# Patient Record
Sex: Female | Born: 1985 | Race: White | Hispanic: No | Marital: Single | Smoking: Never smoker
Health system: Southern US, Community
[De-identification: ages and names within clinical notes are randomized; demographics above are authoritative.]

## PROBLEM LIST (undated history)

## (undated) DIAGNOSIS — IMO0002 Reserved for concepts with insufficient information to code with codable children: Secondary | ICD-10-CM

## (undated) DIAGNOSIS — M0800 Unspecified juvenile rheumatoid arthritis of unspecified site: Secondary | ICD-10-CM

## (undated) HISTORY — DX: Unspecified juvenile rheumatoid arthritis of unspecified site: M08.00

## (undated) HISTORY — DX: Reserved for concepts with insufficient information to code with codable children: IMO0002

---

## 2008-03-25 ENCOUNTER — Emergency Department (HOSPITAL_COMMUNITY): Admission: EM | Admit: 2008-03-25 | Discharge: 2008-03-25 | Payer: Self-pay | Admitting: Emergency Medicine

## 2010-06-10 NOTE — L&D Delivery Note (Signed)
SVD VIABLE FEMALE APGAR 8/9.  NO EPISIOTOMY 2 DEGREE TEAR WITH REPAIR WITH 2-0 CHROMIC PLACENTA INTACT WITH 3 VESSEL CORD ANESTHESIA EPIDURAL AND LOCAL EBL 500 CC CHORIOAMNIONITIS ON AMP AND GENT

## 2010-09-21 LAB — HIV ANTIBODY (ROUTINE TESTING W REFLEX): HIV: NONREACTIVE

## 2010-09-21 LAB — HEPATITIS B SURFACE ANTIGEN: Hepatitis B Surface Ag: NEGATIVE

## 2011-03-25 ENCOUNTER — Inpatient Hospital Stay (HOSPITAL_COMMUNITY): Admission: AD | Admit: 2011-03-25 | Payer: Self-pay | Source: Ambulatory Visit | Admitting: Obstetrics and Gynecology

## 2011-03-27 ENCOUNTER — Encounter (HOSPITAL_COMMUNITY): Payer: Self-pay | Admitting: *Deleted

## 2011-03-27 ENCOUNTER — Telehealth (HOSPITAL_COMMUNITY): Payer: Self-pay | Admitting: *Deleted

## 2011-03-27 NOTE — Telephone Encounter (Signed)
Preadmission screen  

## 2011-03-31 ENCOUNTER — Inpatient Hospital Stay (HOSPITAL_COMMUNITY)
Admission: RE | Admit: 2011-03-31 | Discharge: 2011-04-04 | DRG: 774 | Disposition: A | Discharge status: Home / Self Care | Payer: Managed Care, Other (non HMO) | Source: Ambulatory Visit | Attending: Obstetrics and Gynecology | Admitting: Obstetrics and Gynecology

## 2011-03-31 ENCOUNTER — Encounter (HOSPITAL_COMMUNITY): Payer: Self-pay

## 2011-03-31 DIAGNOSIS — O481 Prolonged pregnancy: Secondary | ICD-10-CM

## 2011-03-31 DIAGNOSIS — O480 Post-term pregnancy: Principal | ICD-10-CM | POA: Diagnosis present

## 2011-03-31 DIAGNOSIS — O411090 Infection of amniotic sac and membranes, unspecified, unspecified trimester, not applicable or unspecified: Secondary | ICD-10-CM | POA: Diagnosis present

## 2011-03-31 LAB — CBC
MCH: 29.3 pg (ref 26.0–34.0)
MCHC: 33.4 g/dL (ref 30.0–36.0)
MCV: 87.6 fL (ref 78.0–100.0)
Platelets: 224 10*3/uL (ref 150–400)
RBC: 4.03 MIL/uL (ref 3.87–5.11)

## 2011-03-31 MED ORDER — OXYTOCIN BOLUS FROM INFUSION
500.0000 mL | Freq: Once | INTRAVENOUS | Status: DC
  Filled 2011-03-31: qty 500

## 2011-03-31 MED ORDER — TERBUTALINE SULFATE 1 MG/ML IJ SOLN: 0.2500 mg | Freq: Once | INTRAMUSCULAR | Status: AC | PRN

## 2011-03-31 MED ORDER — FLEET ENEMA 7-19 GM/118ML RE ENEM: 1.0000 | ENEMA | RECTAL | Status: DC | PRN

## 2011-03-31 MED ORDER — LACTATED RINGERS IV SOLN
INTRAVENOUS | Status: DC
  Administered 2011-03-31 – 2011-04-01 (×2): via INTRAVENOUS
  Administered 2011-04-01 (×2): 125 mL/h via INTRAVENOUS

## 2011-03-31 MED ORDER — OXYCODONE-ACETAMINOPHEN 5-325 MG PO TABS: 2.0000 | ORAL_TABLET | ORAL | Status: DC | PRN

## 2011-03-31 MED ORDER — LACTATED RINGERS IV SOLN
500.0000 mL | INTRAVENOUS | Status: DC | PRN
  Administered 2011-04-01 (×2): 300 mL via INTRAVENOUS

## 2011-03-31 MED ORDER — IBUPROFEN 600 MG PO TABS: 600.0000 mg | ORAL_TABLET | Freq: Four times a day (QID) | ORAL | Status: DC | PRN

## 2011-03-31 MED ORDER — LIDOCAINE HCL (PF) 1 % IJ SOLN
30.0000 mL | INTRAMUSCULAR | Status: DC | PRN
  Filled 2011-03-31 (×2): qty 30

## 2011-03-31 MED ORDER — OXYTOCIN 20 UNITS IN LACTATED RINGERS INFUSION - SIMPLE: 125.0000 mL/h | Freq: Once | INTRAVENOUS | Status: DC

## 2011-03-31 MED ORDER — ACETAMINOPHEN 325 MG PO TABS
650.0000 mg | ORAL_TABLET | ORAL | Status: DC | PRN
  Administered 2011-04-01: 650 mg via ORAL
  Filled 2011-03-31: qty 2

## 2011-03-31 MED ORDER — ZOLPIDEM TARTRATE 10 MG PO TABS
10.0000 mg | ORAL_TABLET | Freq: Every evening | ORAL | Status: DC | PRN
  Administered 2011-03-31: 10 mg via ORAL
  Filled 2011-03-31: qty 1

## 2011-03-31 MED ORDER — ONDANSETRON HCL 4 MG/2ML IJ SOLN: 4.0000 mg | Freq: Four times a day (QID) | INTRAMUSCULAR | Status: DC | PRN

## 2011-03-31 MED ORDER — CITRIC ACID-SODIUM CITRATE 334-500 MG/5ML PO SOLN: 30.0000 mL | ORAL | Status: DC | PRN

## 2011-03-31 MED ORDER — DINOPROSTONE 10 MG VA INST
10.0000 mg | VAGINAL_INSERT | Freq: Once | VAGINAL | Status: AC
  Administered 2011-03-31: 10 mg via VAGINAL
  Filled 2011-03-31: qty 1

## 2011-03-31 NOTE — Progress Notes (Signed)
Dr. Rana Snare called and informed of induction arrival, vag exam, FHR, and UC pattern.  Admission orders received and entered.

## 2011-03-31 NOTE — Progress Notes (Addendum)
Cervadil placed.  Patient tolerated well.  Ambien also given at this time.

## 2011-04-01 ENCOUNTER — Encounter (HOSPITAL_COMMUNITY): Payer: Self-pay | Admitting: Anesthesiology

## 2011-04-01 ENCOUNTER — Ambulatory Visit (HOSPITAL_COMMUNITY): Payer: Managed Care, Other (non HMO) | Admitting: Anesthesiology

## 2011-04-01 LAB — RPR: RPR Ser Ql: NONREACTIVE

## 2011-04-01 MED ORDER — DIPHENHYDRAMINE HCL 50 MG/ML IJ SOLN: 12.5000 mg | INTRAMUSCULAR | Status: DC | PRN

## 2011-04-01 MED ORDER — EPHEDRINE 5 MG/ML INJ
10.0000 mg | INTRAVENOUS | Status: DC | PRN
  Filled 2011-04-01 (×2): qty 4

## 2011-04-01 MED ORDER — FENTANYL 2.5 MCG/ML BUPIVACAINE 1/10 % EPIDURAL INFUSION (WH - ANES)
14.0000 mL/h | INTRAMUSCULAR | Status: DC
  Administered 2011-04-01 – 2011-04-02 (×4): 14 mL/h via EPIDURAL
  Filled 2011-04-01 (×4): qty 60

## 2011-04-01 MED ORDER — SODIUM CHLORIDE 0.9 % IV SOLN
2.0000 g | INTRAVENOUS | Status: DC
  Administered 2011-04-01 – 2011-04-02 (×4): 2 g via INTRAVENOUS
  Filled 2011-04-01 (×5): qty 2000

## 2011-04-01 MED ORDER — LACTATED RINGERS IV SOLN
500.0000 mL | Freq: Once | INTRAVENOUS | Status: AC
  Administered 2011-04-01: 500 mL via INTRAVENOUS

## 2011-04-01 MED ORDER — EPHEDRINE 5 MG/ML INJ
10.0000 mg | INTRAVENOUS | Status: DC | PRN
  Filled 2011-04-01: qty 4

## 2011-04-01 MED ORDER — OXYTOCIN 20 UNITS IN LACTATED RINGERS INFUSION - SIMPLE
1.0000 m[IU]/min | INTRAVENOUS | Status: DC
  Administered 2011-04-01: 2 m[IU]/min via INTRAVENOUS
  Administered 2011-04-02: 333 m[IU]/min via INTRAVENOUS
  Filled 2011-04-01: qty 1000

## 2011-04-01 MED ORDER — LIDOCAINE HCL 1.5 % IJ SOLN
INTRAMUSCULAR | Status: DC | PRN
  Administered 2011-04-01 (×2): 5 mL via EPIDURAL

## 2011-04-01 MED ORDER — PHENYLEPHRINE 40 MCG/ML (10ML) SYRINGE FOR IV PUSH (FOR BLOOD PRESSURE SUPPORT)
80.0000 ug | PREFILLED_SYRINGE | INTRAVENOUS | Status: DC | PRN
Start: 2011-04-01 — End: 2011-04-02
  Filled 2011-04-01 (×2): qty 5

## 2011-04-01 MED ORDER — TERBUTALINE SULFATE 1 MG/ML IJ SOLN: 0.2500 mg | Freq: Once | INTRAMUSCULAR | Status: AC | PRN

## 2011-04-01 MED ORDER — GENTAMICIN SULFATE 40 MG/ML IJ SOLN
150.0000 mg | Freq: Three times a day (TID) | INTRAVENOUS | Status: DC
  Administered 2011-04-01: 150 mg via INTRAVENOUS
  Filled 2011-04-01 (×3): qty 3.75

## 2011-04-01 MED ORDER — PHENYLEPHRINE 40 MCG/ML (10ML) SYRINGE FOR IV PUSH (FOR BLOOD PRESSURE SUPPORT)
80.0000 ug | PREFILLED_SYRINGE | INTRAVENOUS | Status: DC | PRN
  Filled 2011-04-01: qty 5

## 2011-04-01 MED ORDER — GENTAMICIN IN SALINE 1.2-0.9 MG/ML-% IV SOLN: 60.0000 mg | Freq: Three times a day (TID) | INTRAVENOUS | Status: DC

## 2011-04-01 NOTE — Anesthesia Procedure Notes (Signed)
Epidural Patient location during procedure: OB Start time: 04/01/2011 1:32 PM  Staffing Performed by: anesthesiologist   Preanesthetic Checklist Completed: patient identified, site marked, surgical consent, pre-op evaluation, timeout performed, IV checked, risks and benefits discussed and monitors and equipment checked  Epidural Patient position: sitting Prep: site prepped and draped and DuraPrep Patient monitoring: continuous pulse ox and blood pressure Approach: midline Injection technique: LOR air and LOR saline  Needle:  Needle type: Tuohy  Needle gauge: 17 G Needle length: 9 cm Needle insertion depth: 6 cm Catheter type: closed end flexible Catheter size: 19 Gauge Catheter at skin depth: 11 cm Test dose: negative  Assessment Events: blood not aspirated, injection not painful, no injection resistance, negative IV test and no paresthesia  Additional Notes Patient identified.  Risk benefits discussed including failed block, incomplete pain control, headache, nerve damage, paralysis, blood pressure changes, nausea, vomiting, reactions to medication both toxic or allergic, and postpartum back pain.  Patient expressed understanding and wished to proceed.  All questions were answered.  Sterile technique used throughout procedure and epidural site dressed with sterile barrier dressing. No paresthesia or other complications noted.The patient did not experience any signs of intravascular injection such as tinnitus or metallic taste in mouth nor signs of intrathecal spread such as rapid motor block. Please see nursing notes for vital signs.

## 2011-04-01 NOTE — Anesthesia Preprocedure Evaluation (Addendum)
Anesthesia Evaluation  Patient identified by MRN, date of birth, ID band Patient awake  General Assessment Comment  Reviewed: Allergy & Precautions, H&P , Patient's Chart, lab work & pertinent test results  Airway Mallampati: II TM Distance: >3 FB Neck ROM: full    Dental No notable dental hx.    Pulmonary  clear to auscultation  Pulmonary exam normal       Cardiovascular regular Normal    Neuro/Psych Negative Neurological ROS  Negative Psych ROS   GI/Hepatic negative GI ROS Neg liver ROS    Endo/Other  Negative Endocrine ROS  Renal/GU negative Renal ROS     Musculoskeletal   Abdominal   Peds  Hematology negative hematology ROS (+)   Anesthesia Other Findings Fluid bolus given  Reproductive/Obstetrics (+) Pregnancy                          Anesthesia Physical Anesthesia Plan  ASA: II  Anesthesia Plan: Epidural   Post-op Pain Management:    Induction:   Airway Management Planned:   Additional Equipment:   Intra-op Plan:   Post-operative Plan:   Informed Consent: I have reviewed the patients History and Physical, chart, labs and discussed the procedure including the risks, benefits and alternatives for the proposed anesthesia with the patient or authorized representative who has indicated his/her understanding and acceptance.     Plan Discussed with:   Anesthesia Plan Comments:         Anesthesia Quick Evaluation

## 2011-04-01 NOTE — Progress Notes (Signed)
  AROM clear cx 1 cm 80 % effaced vtx at - 1 FHR reactive no decel procede with pitocin

## 2011-04-01 NOTE — Consult Note (Signed)
ANTIBIOTIC CONSULT NOTE - INITIAL  Pharmacy Consult for Gentamicin Indication: Maternal temp during labor  No Known Allergies  Patient Measurements: Height: 5\' 2"  (157.5 cm) Weight: 186 lb (84.369 kg) IBW/kg (Calculated) : 50.1  Adjusted Body Weight: 60.4  Vital Signs: Temp: 99.6 F (37.6 C) (10/22 2201) Temp src: Axillary (10/22 2201) BP: 115/53 mmHg (10/22 2201) Pulse Rate: 93  (10/22 2201)    Labs:  [facility] 03/31/11 2053  WBC 13.5*  HGB 11.8*  PLT 224  LABCREA --  CREATININE --   Screatinine estimated = 0.7  CrCl > 185ml/min   Medications:  Ampicillin 2 gram IV q4h  Assessment: 24yo F admitted for IOL. Maternal temp during labor.  Goal of Therapy:  Gentamicin peaks 6-8 mcg/ml; Trough < 32mcg/ml  Plan:  1. Gentamicin 150mg  IV q8h. 2. Will draw Screatinine if continued postpartum. 3. Will continue to follow and check levels as clinically indicated. Thanks! Claybon Jabs 04/01/2011,10:34 PM

## 2011-04-01 NOTE — Progress Notes (Signed)
  DECELERATION IN FHR TO 90'S FOR 4-5 MINUTES RESPONDED TO POSITION CHANGE OXYGEN AND STOPPING PITOCIN CX 5CM AND 90 % VTX -1 IUPC PLACED WILL FOLLOW  MAY NEED TO RESTART PITOCIN IS NO PROGRESSION IF LEADS TO RECCURENT DECELS CONSIDER CESAREAN

## 2011-04-01 NOTE — H&P (Signed)
Angel Wong is a 25 y.o. female presenting for induction at 41 weeks. uncomplicated PNC .  Negative GBS Maternal Medical History:  Prenatal Complications - Diabetes: none.    OB History    Grav Para Term Preterm Abortions TAB SAB Ect Mult Living   1              Past Medical History  Diagnosis Date  . Abnormal Pap smear     this pregnancy  . Juvenile rheumatoid arthritis     at 25 years old   No past surgical history on file. Family History: family history includes Diabetes in her maternal grandfather and Hypertension in her maternal grandmother.  There is no history of Anesthesia problems, and Hypotension, and Malignant hyperthermia, and Pseudochol deficiency, . Social History:  reports that she has never smoked. She has never used smokeless tobacco. She reports that she does not drink alcohol or use illicit drugs.  ROS  Dilation: 1.5 Effacement (%): 80 Station: -2 Exam by:: Bennye Alm, RN Blood pressure 112/62, pulse 89, temperature 98 F (36.7 C), temperature source Oral, resp. rate 18, height 5\' 2"  (1.575 m), weight 186 lb (84.369 kg), last menstrual period 07/01/2010. Maternal Exam:  Uterine Assessment: Contraction strength is mild.  Contraction frequency is irregular.   Abdomen: Patient reports no abdominal tenderness. Fundal height is c/w dates.   Estimated fetal weight is 8 lbs.   Fetal presentation: vertex  Pelvis: adequate for delivery.   Cervix: Cervix evaluated by digital exam.     Physical Exam  Prenatal labs: ABO, Rh: O/Positive/-- (04/13 0000) Antibody: Negative (04/13 0000) Rubella: Immune (04/13 0000) RPR: NON REACTIVE (10/21 2053)  HBsAg: Negative (04/13 0000)  HIV: Non-reactive (04/13 0000)  GBS: Negative (09/19 0000)   Assessment/Plan: IUP at 41 weeks for induction.  Risk of Pitocin discussed.  Legend Tumminello S 04/01/2011, 7:35 AM

## 2011-04-01 NOTE — Progress Notes (Signed)
On 4 mu pitocin cx now 8 cm 100 % vtx -1 fhr reactive w/o decel accel with scalp stim Maternal temp 100 Continue pitocin Begin amp and gent for maternal temp and chorioamnionitis

## 2011-04-02 ENCOUNTER — Encounter (HOSPITAL_COMMUNITY): Payer: Self-pay

## 2011-04-02 MED ORDER — BENZOCAINE-MENTHOL 20-0.5 % EX AERO: 1.0000 "application " | INHALATION_SPRAY | CUTANEOUS | Status: DC | PRN

## 2011-04-02 MED ORDER — FLEET ENEMA 7-19 GM/118ML RE ENEM
1.0000 | ENEMA | RECTAL | Status: DC | PRN
Start: 2011-04-02 — End: 2011-04-04

## 2011-04-02 MED ORDER — ONDANSETRON HCL 4 MG/2ML IJ SOLN: 4.0000 mg | INTRAMUSCULAR | Status: DC | PRN

## 2011-04-02 MED ORDER — ONDANSETRON HCL 4 MG PO TABS: 4.0000 mg | ORAL_TABLET | ORAL | Status: DC | PRN

## 2011-04-02 MED ORDER — DIBUCAINE 1 % RE OINT
1.0000 "application " | TOPICAL_OINTMENT | RECTAL | Status: DC | PRN
  Administered 2011-04-02: 1 via RECTAL
  Filled 2011-04-02: qty 28

## 2011-04-02 MED ORDER — SENNOSIDES-DOCUSATE SODIUM 8.6-50 MG PO TABS: 2.0000 | ORAL_TABLET | Freq: Every day | ORAL | Status: DC

## 2011-04-02 MED ORDER — BENZOCAINE-MENTHOL 20-0.5 % EX AERO
INHALATION_SPRAY | CUTANEOUS | Status: AC
  Administered 2011-04-02: 06:00:00
  Filled 2011-04-02: qty 56

## 2011-04-02 MED ORDER — ZOLPIDEM TARTRATE 5 MG PO TABS: 5.0000 mg | ORAL_TABLET | Freq: Every evening | ORAL | Status: DC | PRN

## 2011-04-02 MED ORDER — LANOLIN HYDROUS EX OINT: TOPICAL_OINTMENT | CUTANEOUS | Status: DC | PRN

## 2011-04-02 MED ORDER — BISACODYL 10 MG RE SUPP: 10.0000 mg | Freq: Every day | RECTAL | Status: DC | PRN

## 2011-04-02 MED ORDER — OXYCODONE-ACETAMINOPHEN 5-325 MG PO TABS
1.0000 | ORAL_TABLET | ORAL | Status: DC | PRN
  Administered 2011-04-02 – 2011-04-03 (×3): 1 via ORAL
  Filled 2011-04-02 (×3): qty 1

## 2011-04-02 MED ORDER — WITCH HAZEL-GLYCERIN EX PADS
1.0000 "application " | MEDICATED_PAD | CUTANEOUS | Status: DC | PRN
  Administered 2011-04-02: 1 via TOPICAL

## 2011-04-02 MED ORDER — TETANUS-DIPHTH-ACELL PERTUSSIS 5-2.5-18.5 LF-MCG/0.5 IM SUSP: 0.5000 mL | Freq: Once | INTRAMUSCULAR | Status: DC

## 2011-04-02 MED ORDER — PRENATAL PLUS 27-1 MG PO TABS
1.0000 | ORAL_TABLET | Freq: Every day | ORAL | Status: DC
  Administered 2011-04-02 – 2011-04-04 (×3): 1 via ORAL
  Filled 2011-04-02 (×3): qty 1

## 2011-04-02 MED ORDER — DIPHENHYDRAMINE HCL 25 MG PO CAPS: 25.0000 mg | ORAL_CAPSULE | Freq: Four times a day (QID) | ORAL | Status: DC | PRN

## 2011-04-02 MED ORDER — IBUPROFEN 600 MG PO TABS
600.0000 mg | ORAL_TABLET | Freq: Four times a day (QID) | ORAL | Status: DC
  Administered 2011-04-02 – 2011-04-04 (×10): 600 mg via ORAL
  Filled 2011-04-02 (×10): qty 1

## 2011-04-02 MED ORDER — SIMETHICONE 80 MG PO CHEW: 80.0000 mg | CHEWABLE_TABLET | ORAL | Status: DC | PRN

## 2011-04-02 NOTE — Progress Notes (Signed)
Post Partum Day 0 Subjective: no complaints, up ad lib and voiding  Objective: Blood pressure 111/64, pulse 99, temperature 98.4 F (36.9 C), temperature source Oral, resp. rate 20, height 5\' 2"  (1.575 m), weight 84.369 kg (186 lb), last menstrual period 07/01/2010, SpO2 98.00%, unknown if currently breastfeeding.  Physical Exam:  General: alert and cooperative Lochia: appropriate Uterine Fundus: firm, non tender Perineum intact, small labial edema noted DVT Evaluation: No evidence of DVT seen on physical exam.   Basename 03/31/11 2053  HGB 11.8*  HCT 35.3*    Assessment/Plan: Plan for discharge tomorrow Continue antibiotics x 24 hrs   LOS: 2 days   Angel Wong G 04/02/2011, 8:36 AM

## 2011-04-03 LAB — CBC
Hemoglobin: 9.2 g/dL — ABNORMAL LOW (ref 12.0–15.0)
MCHC: 33.3 g/dL (ref 30.0–36.0)
RBC: 3.1 MIL/uL — ABNORMAL LOW (ref 3.87–5.11)

## 2011-04-03 NOTE — Addendum Note (Signed)
Addendum  created 04/03/11 0454 by Doreene Burke   Modules edited:Charges VN, Notes Section

## 2011-04-03 NOTE — Progress Notes (Signed)
Post Partum Day 1 Subjective: no complaints, up ad lib and voiding  Objective: Blood pressure 115/73, pulse 73, temperature 97.4 F (36.3 C), temperature source Oral, resp. rate 18, height 5\' 2"  (1.575 m), weight 84.369 kg (186 lb), last menstrual period 07/01/2010, SpO2 93.00%, unknown if currently breastfeeding.  Physical Exam:  General: alert and cooperative Lochia: appropriate Uterine Fundus: firm Perineum intact DVT Evaluation: No evidence of DVT seen on physical exam.   Basename 04/03/11 0010 03/31/11 2053  HGB 9.2* 11.8*  HCT 27.6* 35.3*    Assessment/Plan: Plan for discharge tomorrow   LOS: 3 days   Torrian Canion G 04/03/2011, 8:25 AM

## 2011-04-03 NOTE — Anesthesia Postprocedure Evaluation (Signed)
Anesthesia Post Note  Patient: Angel Wong  Procedure(s) Performed: * No procedures listed *  Anesthesia type: Epidural  Patient location: Mother/Baby  Post pain: Pain level controlled  Post assessment: Post-op Vital signs reviewed  Last Vitals:  Filed Vitals:   04/03/11 0533  BP: 115/73  Pulse: 73  Temp: 36.3 C  Resp: 18    Post vital signs: Reviewed  Level of consciousness: awake  Complications: No apparent anesthesia complications 

## 2011-04-03 NOTE — Anesthesia Postprocedure Evaluation (Signed)
Anesthesia Post Note  Patient: Angel Wong  Procedure(s) Performed: * No procedures listed *  Anesthesia type: Epidural  Patient location: Mother/Baby  Post pain: Pain level controlled  Post assessment: Post-op Vital signs reviewed  Last Vitals:  Filed Vitals:   04/03/11 0533  BP: 115/73  Pulse: 73  Temp: 36.3 C  Resp: 18    Post vital signs: Reviewed  Level of consciousness: awake  Complications: No apparent anesthesia complications

## 2011-04-04 NOTE — Progress Notes (Signed)
Post Partum Day two Subjective: no complaints  Objective: Blood pressure 106/64, pulse 84, temperature 97.9 F (36.6 C), temperature source Oral, resp. rate 18, height 5\' 2"  (1.575 m), weight 186 lb (84.369 kg), last menstrual period 07/01/2010, SpO2 93.00%, unknown if currently breastfeeding.  Physical Exam:  General: alert Lochia: appropriate Uterine Fundus: firm Incision: vag del DVT Evaluation: No evidence of DVT seen on physical exam.   Basename 04/03/11 0010  HGB 9.2*  HCT 27.6*    Assessment/Plan: Discharge home   LOS: 4 days   Angel Wong S 04/04/2011, 8:24 AM

## 2011-04-04 NOTE — Discharge Summary (Signed)
Obstetric Discharge Summary Reason for Admission: induction of labor  Post dates Prenatal Procedures: ultrasound Intrapartum Procedures: spontaneous vaginal delivery Postpartum Procedures: none Complications-Operative and Postpartum: second degree perineal laceration Hemoglobin  Date Value Range Status  04/03/2011 9.2* 12.0-15.0 (g/dL) Final     HCT  Date Value Range Status  04/03/2011 27.6* 36.0-46.0 (%) Final    Discharge Diagnoses: Term Pregnancy-delivered  Discharge Information: Date: 04/04/2011 Activity: pelvic rest Diet: routine Medications: PNV Condition: stable Instructions: refer to practice specific booklet Discharge to: home   Newborn Data: Live born female  Birth Weight: 7 lb 5.8 oz (3340 g) APGAR: 8, 9  Home with mother.  Angel Wong 04/04/2011, 8:25 AM

## 2011-04-05 NOTE — Progress Notes (Signed)
UR Chart review completed.  

## 2011-04-09 ENCOUNTER — Encounter (HOSPITAL_COMMUNITY): Payer: Managed Care, Other (non HMO)

## 2013-08-15 ENCOUNTER — Emergency Department (HOSPITAL_COMMUNITY): Payer: Managed Care, Other (non HMO)

## 2013-08-15 ENCOUNTER — Emergency Department (HOSPITAL_COMMUNITY)
Admission: EM | Admit: 2013-08-15 | Discharge: 2013-08-15 | Disposition: A | Discharge status: Home / Self Care | Payer: Managed Care, Other (non HMO) | Attending: Emergency Medicine | Admitting: Emergency Medicine

## 2013-08-15 ENCOUNTER — Encounter (HOSPITAL_COMMUNITY): Payer: Self-pay | Admitting: Emergency Medicine

## 2013-08-15 DIAGNOSIS — Z8739 Personal history of other diseases of the musculoskeletal system and connective tissue: Secondary | ICD-10-CM | POA: Insufficient documentation

## 2013-08-15 DIAGNOSIS — S61409A Unspecified open wound of unspecified hand, initial encounter: Secondary | ICD-10-CM | POA: Insufficient documentation

## 2013-08-15 DIAGNOSIS — W010XXA Fall on same level from slipping, tripping and stumbling without subsequent striking against object, initial encounter: Secondary | ICD-10-CM | POA: Insufficient documentation

## 2013-08-15 DIAGNOSIS — Y929 Unspecified place or not applicable: Secondary | ICD-10-CM | POA: Insufficient documentation

## 2013-08-15 DIAGNOSIS — S61412A Laceration without foreign body of left hand, initial encounter: Secondary | ICD-10-CM

## 2013-08-15 DIAGNOSIS — Y9301 Activity, walking, marching and hiking: Secondary | ICD-10-CM | POA: Insufficient documentation

## 2013-08-15 DIAGNOSIS — W01119A Fall on same level from slipping, tripping and stumbling with subsequent striking against unspecified sharp object, initial encounter: Secondary | ICD-10-CM | POA: Insufficient documentation

## 2013-08-15 NOTE — ED Provider Notes (Signed)
Medical screening examination/treatment/procedure(s) were performed by non-physician practitioner and as supervising physician I was immediately available for consultation/collaboration.   Camdin Hegner, MD 08/15/13 0811 

## 2013-08-15 NOTE — Discharge Instructions (Signed)
Laceration Care, Adult °A laceration is a cut or lesion that goes through all layers of the skin and into the tissue just beneath the skin. °TREATMENT  °Some lacerations may not require closure. Some lacerations may not be able to be closed due to an increased risk of infection. It is important to see your caregiver as soon as possible after an injury to minimize the risk of infection and maximize the opportunity for successful closure. °If closure is appropriate, pain medicines may be given, if needed. The wound will be cleaned to help prevent infection. Your caregiver will use stitches (sutures), staples, wound glue (adhesive), or skin adhesive strips to repair the laceration. These tools bring the skin edges together to allow for faster healing and a better cosmetic outcome. However, all wounds will heal with a scar. Once the wound has healed, scarring can be minimized by covering the wound with sunscreen during the day for 1 full year. °HOME CARE INSTRUCTIONS  °For sutures or staples: °· Keep the wound clean and dry. °· If you were given a bandage (dressing), you should change it at least once a day. Also, change the dressing if it becomes wet or dirty, or as directed by your caregiver. °· Wash the wound with soap and water 2 times a day. Rinse the wound off with water to remove all soap. Pat the wound dry with a clean towel. °· After cleaning, apply a thin layer of the antibiotic ointment as recommended by your caregiver. This will help prevent infection and keep the dressing from sticking. °· You may shower as usual after the first 24 hours. Do not soak the wound in water until the sutures are removed. °· Only take over-the-counter or prescription medicines for pain, discomfort, or fever as directed by your caregiver. °· Get your sutures or staples removed as directed by your caregiver. °For skin adhesive strips: °· Keep the wound clean and dry. °· Do not get the skin adhesive strips wet. You may bathe  carefully, using caution to keep the wound dry. °· If the wound gets wet, pat it dry with a clean towel. °· Skin adhesive strips will fall off on their own. You may trim the strips as the wound heals. Do not remove skin adhesive strips that are still stuck to the wound. They will fall off in time. °For wound adhesive: °· You may briefly wet your wound in the shower or bath. Do not soak or scrub the wound. Do not swim. Avoid periods of heavy perspiration until the skin adhesive has fallen off on its own. After showering or bathing, gently pat the wound dry with a clean towel. °· Do not apply liquid medicine, cream medicine, or ointment medicine to your wound while the skin adhesive is in place. This may loosen the film before your wound is healed. °· If a dressing is placed over the wound, be careful not to apply tape directly over the skin adhesive. This may cause the adhesive to be pulled off before the wound is healed. °· Avoid prolonged exposure to sunlight or tanning lamps while the skin adhesive is in place. Exposure to ultraviolet light in the first year will darken the scar. °· The skin adhesive will usually remain in place for 5 to 10 days, then naturally fall off the skin. Do not pick at the adhesive film. °You may need a tetanus shot if: °· You cannot remember when you had your last tetanus shot. °· You have never had a tetanus   shot. °If you get a tetanus shot, your arm may swell, get red, and feel warm to the touch. This is common and not a problem. If you need a tetanus shot and you choose not to have one, there is a rare chance of getting tetanus. Sickness from tetanus can be serious. °SEEK MEDICAL CARE IF:  °· You have redness, swelling, or increasing pain in the wound. °· You see a red line that goes away from the wound. °· You have yellowish-white fluid (pus) coming from the wound. °· You have a fever. °· You notice a bad smell coming from the wound or dressing. °· Your wound breaks open before or  after sutures have been removed. °· You notice something coming out of the wound such as wood or glass. °· Your wound is on your hand or foot and you cannot move a finger or toe. °SEEK IMMEDIATE MEDICAL CARE IF:  °· Your pain is not controlled with prescribed medicine. °· You have severe swelling around the wound causing pain and numbness or a change in color in your arm, hand, leg, or foot. °· Your wound splits open and starts bleeding. °· You have worsening numbness, weakness, or loss of function of any joint around or beyond the wound. °· You develop painful lumps near the wound or on the skin anywhere on your body. °MAKE SURE YOU:  °· Understand these instructions. °· Will watch your condition. °· Will get help right away if you are not doing well or get worse. °Document Released: 05/27/2005 Document Revised: 08/19/2011 Document Reviewed: 11/20/2010 °Exit[employer]® Patient Information ©2014 [employer]. ° °Sutured Wound Care °Sutures are stitches that can be used to close wounds. Wound care helps prevent pain and infection.  °HOME CARE INSTRUCTIONS  °· Rest and elevate the injured area until all the pain and swelling are gone. °· Only take over-the-counter or prescription medicines for pain, discomfort, or fever as directed by your caregiver. °· After 48 hours, gently wash the area with mild soap and water once a day, or as directed. Rinse off the soap. Pat the area dry with a clean towel. Do not rub the wound. This may cause bleeding. °· Follow your caregiver's instructions for how often to change the bandage (dressing). Stop using a dressing after 2 days or after the wound stops draining. °· If the dressing sticks, moisten it with soapy water and gently remove it. °· Apply ointment on the wound as directed. °· Avoid stretching a sutured wound. °· Drink enough fluids to keep your urine clear or pale yellow. °· Follow up with your caregiver for suture removal as directed. °· Use sunscreen on your wound for the next  3 to 6 months so the scar will not darken. °SEEK IMMEDIATE MEDICAL CARE IF:  °· Your wound becomes red, swollen, hot, or tender. °· You have increasing pain in the wound. °· You have a red streak that extends from the wound. °· There is pus coming from the wound. °· You have a fever. °· You have shaking chills. °· There is a bad smell coming from the wound. °· You have persistent bleeding from the wound. °MAKE SURE YOU:  °· Understand these instructions. °· Will watch your condition. °· Will get help right away if you are not doing well or get worse. °Document Released: 07/04/2004 Document Revised: 08/19/2011 Document Reviewed: 09/30/2010 °ExitCare® Patient Information ©2014 [employer]. ° °

## 2013-08-15 NOTE — ED Notes (Signed)
Pt states she as walking on sidewalk fell forward with beer bottle in hand, multiple lacerations noted to L palm with debris noted, abrasion noted to R knee.

## 2013-08-15 NOTE — ED Provider Notes (Signed)
CSN: [id number]     Arrival date & time 08/15/13  0307 History   First MD Initiated Contact with Patient 08/15/13 [phone]     Chief Complaint  Patient presents with  . Laceration     (Consider location/radiation/quality/duration/timing/severity/associated sxs/prior Treatment) HPI Comments: Patient is 28 year old female who presents to the ED with left hand laceration after she tripped while walking and fell onto a glass beer bottle and cut the palm of her hand in several areas.  Tetanus UTD, bleeding controlled prior to arrival.  FROM of fingers including flexion as well as wrist.  Patient is a 28 y.o. female presenting with skin laceration. The history is provided by the patient. No language interpreter was used.  Laceration Location:  Hand Hand laceration location:  L hand and L palm Depth:  Through dermis Quality: avulsion and jagged   Bleeding: venous and controlled   Laceration mechanism:  Broken glass Pain details:    Quality:  Aching   Severity:  Mild   Timing:  Constant   Progression:  Worsening Foreign body present:  No foreign bodies Relieved by:  Nothing Worsened by:  Nothing tried Ineffective treatments:  None tried Tetanus status:  Up to date   Past Medical History  Diagnosis Date  . Abnormal Pap smear     this pregnancy  . Juvenile rheumatoid arthritis     at 28 years old   History reviewed. No pertinent past surgical history. Family History  Problem Relation Age of Onset  . Hypertension Maternal Grandmother   . Diabetes Maternal Grandfather   . Anesthesia problems Neg Hx   . Hypotension Neg Hx   . Malignant hyperthermia Neg Hx   . Pseudochol deficiency Neg Hx    History  Substance Use Topics  . Smoking status: Never Smoker   . Smokeless tobacco: Never Used  . Alcohol Use: Yes     Comment: occ   OB History   Grav Para Term Preterm Abortions TAB SAB Ect Mult Living   1 1 1       1      Review of Systems  All other systems reviewed and are  negative.      Allergies  Review of patient's allergies indicates no known allergies.  Home Medications  No current outpatient prescriptions on file. BP 114/65  Pulse 102  Temp(Src) 97.3 F (36.3 C) (Oral)  Resp 16  Ht 5\' 2"  (1.575 m)  Wt 142 lb (64.411 kg)  BMI 25.97 kg/m2  SpO2 98%  LMP 08/08/2013 Physical Exam  Nursing note and vitals reviewed. Constitutional: She is oriented to person, place, and time. She appears well-developed and well-nourished. No distress.  HENT:  Head: Normocephalic and atraumatic.  Mouth/Throat: Oropharynx is clear and moist.  Eyes: Conjunctivae are normal. No scleral icterus.  Pulmonary/Chest: Effort normal.  Musculoskeletal: Normal range of motion. She exhibits no edema and no tenderness.       Left hand: She exhibits laceration. She exhibits normal range of motion, no tenderness, no deformity and no swelling. Normal sensation noted. Normal strength noted. She exhibits no finger abduction, no thumb/finger opposition and no wrist extension trouble.       Hands: Neurological: She is alert and oriented to person, place, and time. She exhibits normal muscle tone. Coordination normal.  Skin: Skin is warm and dry. No rash noted. No erythema. No pallor.  Psychiatric: She has a normal mood and affect. Her behavior is normal. Judgment and thought content normal.  ED Course  Procedures (including critical care time) Labs Review Labs Reviewed - No data to display Imaging Review Dg Hand Complete Left  08/15/2013   CLINICAL DATA:  Laceration to palm of hand. Evaluate for foreign body.  EXAM: LEFT HAND - COMPLETE 3+ VIEW  COMPARISON:  None.  FINDINGS: Bandaging material overlies the proximal left hand with underlying soft tissue irregularity, compatible laceration. The no retained foreign body identified. No acute fracture dislocation. Joint spaces are maintained.  IMPRESSION: Soft tissue laceration to palm of hand. No radiopaque foreign body identified. No  acute fracture or dislocation.   Electronically Signed   By: Rise MuBenjamin  McClintock M.D.   On: 08/15/2013 04:21     EKG Interpretation None     LACERATION REPAIR Performed by: Cherrie DistanceSANFORD,Kade Rickels C. Authorized by: Patrecia PourSANFORD,Keyatta Tolles C. Consent: Verbal consent obtained. Risks and benefits: risks, benefits and alternatives were discussed Consent given by: patient Patient identity confirmed: provided demographic data Prepped and Draped in normal sterile fashion Wound explored  Laceration Location: left hand  Laceration Length: 6cm  No Foreign Bodies seen or palpated  Anesthesia: local infiltration  Local anesthetic: lidocaine 1% without epinephrine  Anesthetic total: 8 ml  Irrigation method: syringe Amount of cleaning: standard  Skin closure: 4.0 prolene  Number of sutures: 11  Technique: simple interrupted  Patient tolerance: Patient tolerated the procedure well with no immediate complications.  MDM   Left hand laceration  Patient presents with laceration to palm of left hand including area of avulsion at thenar imminence  - I have sutured this flap down but have informed the patient that she could still likely loose this flap of skin because of the lack of blood supply to the area.  Other areas are linear and approximate well.    Izola PriceFrances C. Marisue HumbleSanford, New JerseyPA-C 08/15/13 [phone]

## 2013-08-22 ENCOUNTER — Encounter (HOSPITAL_COMMUNITY): Payer: Self-pay | Admitting: Emergency Medicine

## 2013-08-22 ENCOUNTER — Emergency Department (HOSPITAL_COMMUNITY)
Admission: EM | Admit: 2013-08-22 | Discharge: 2013-08-22 | Disposition: A | Discharge status: Home / Self Care | Payer: Managed Care, Other (non HMO) | Attending: Emergency Medicine | Admitting: Emergency Medicine

## 2013-08-22 DIAGNOSIS — Z4802 Encounter for removal of sutures: Secondary | ICD-10-CM | POA: Insufficient documentation

## 2013-08-22 NOTE — ED Provider Notes (Signed)
Medical screening examination/treatment/procedure(s) were performed by non-physician practitioner and as supervising physician I was immediately available for consultation/collaboration.   EKG Interpretation None       Keyontae Huckeby R. Joaquina Nissen, MD 08/22/13 1523 

## 2013-08-22 NOTE — Discharge Instructions (Signed)
Staple Care and Removal Your caregiver has used staples today to repair your wound. Staples are used to help a wound heal faster by holding the edges of the wound together. The staples can be removed when the wound has healed well enough to stay together after the staples are removed. A dressing (wound covering), depending on the location of the wound, may have been applied. This may be changed once per day or as instructed. If the dressing sticks, it may be soaked off with soapy water or hydrogen peroxide. Only take over-the-counter or prescription medicines for pain, discomfort, or fever as directed by your caregiver.  If you did not receive a tetanus shot today because you did not recall when your last one was given, check with your caregiver when you have your staples removed to determine if one is needed. Return to your caregiver's office in 1 week or as suggested to have your staples removed. SEEK IMMEDIATE MEDICAL CARE IF:   You have redness, swelling, or increasing pain in the wound.  You have pus coming from the wound.  You have a fever.  You notice a bad smell coming from the wound or dressing.  Your wound edges break open after staples have been removed. Document Released: 02/19/2001 Document Revised: 08/19/2011 Document Reviewed: 03/06/2005 [facility] Patient Information 2014 [place], [place].  Suture Removal, Care After Refer to this sheet in the next few weeks. These instructions provide you with information on caring for yourself after your procedure. Your health care provider may also give you more specific instructions. Your treatment has been planned according to current medical practices, but problems sometimes occur. Call your health care provider if you have any problems or questions after your procedure. WHAT TO EXPECT AFTER THE PROCEDURE After your stitches (sutures) are removed, it is typical to have the following:  Some discomfort and swelling in the wound  area.  Slight redness in the area. HOME CARE INSTRUCTIONS   If you have skin adhesive strips over the wound area, do not take the strips off. They will fall off on their own in a few days. If the strips remain in place after 14 days, you may remove them.  Change any bandages (dressings) at least once a day or as directed by your health care provider. If the bandage sticks, soak it off with warm, soapy water.  Apply cream or ointment only as directed by your health care provider. If using cream or ointment, wash the area with soap and water 2 times a day to remove all the cream or ointment. Rinse off the soap and pat the area dry with a clean towel.  Keep the wound area dry and clean. If the bandage becomes wet or dirty, or if it develops a bad smell, change it as soon as possible.  Continue to protect the wound from injury.  Use sunscreen when out in the sun. New scars become sunburned easily. SEEK MEDICAL CARE IF:  You have increasing redness, swelling, or pain in the wound.  You see pus coming from the wound.  You have a fever.  You notice a bad smell coming from the wound or dressing.  Your wound breaks open (edges not staying together). Document Released: 02/19/2001 Document Revised: 03/17/2013 Document Reviewed: 01/06/2013 [facility] Patient Information 2014 [place], [place].

## 2013-08-22 NOTE — ED Notes (Signed)
Pt here for recheck and removal of stitches to left hand

## 2013-08-22 NOTE — ED Provider Notes (Signed)
CSN: [id number]     Arrival date & time 08/22/13  0920 History   First MD Initiated Contact with Patient 08/22/13 1006     Chief Complaint  Patient presents with  . Suture / Staple Removal     (Consider location/radiation/quality/duration/timing/severity/associated sxs/prior Treatment) HPI  This patient presents to the emergency department requesting suture removal. She obtained her sutures exactly one week ago. She has been keeping it clean and dry but not letting any air touch. She's had no concerns. No fevers or discharge from the area. No increased pain.  Past Medical History  Diagnosis Date  . Abnormal Pap smear     this pregnancy  . Juvenile rheumatoid arthritis     at 28 years old   History reviewed. No pertinent past surgical history. Family History  Problem Relation Age of Onset  . Hypertension Maternal Grandmother   . Diabetes Maternal Grandfather   . Anesthesia problems Neg Hx   . Hypotension Neg Hx   . Malignant hyperthermia Neg Hx   . Pseudochol deficiency Neg Hx    History  Substance Use Topics  . Smoking status: Never Smoker   . Smokeless tobacco: Never Used  . Alcohol Use: Yes     Comment: occ   OB History   Grav Para Term Preterm Abortions TAB SAB Ect Mult Living   1 1 1       1      Review of Systems  The patient denies anorexia, fever, weight loss,, vision loss, decreased hearing, hoarseness, chest pain, syncope, dyspnea on exertion, peripheral edema, balance deficits, hemoptysis, abdominal pain, melena, hematochezia, severe indigestion/heartburn, hematuria, incontinence, genital sores, muscle weakness, suspicious skin lesions, transient blindness, difficulty walking, depression, unusual weight change, abnormal bleeding, enlarged lymph nodes, angioedema, and breast masses.   Allergies  Review of patient's allergies indicates no known allergies.  Home Medications   Current Outpatient Rx  Name  Route  Sig  Dispense  Refill  . ibuprofen  (ADVIL,MOTRIN) 200 MG tablet   Oral   Take 800 mg by mouth every 6 (six) hours as needed for moderate pain.          BP 116/66  Pulse 62  Temp(Src) 98.4 F (36.9 C) (Oral)  Resp 16  SpO2 99%  LMP 08/08/2013 Physical Exam  Nursing note and vitals reviewed. Constitutional: She appears well-developed and well-nourished. No distress.  HENT:  Head: Normocephalic and atraumatic.  Eyes: Pupils are equal, round, and reactive to light.  Neck: Normal range of motion. Neck supple.  Cardiovascular: Normal rate and regular rhythm.   Pulmonary/Chest: Effort normal.  Abdominal: Soft.  Musculoskeletal:       Hands: Patient has 3 wounds to her hand.  Two of the wounds are dry and well healed. The 3rd, over the thenar eminence, is moist and has not healed completely.   Neither wounds appear infected or have tenderness to palpation.No induration,  Neurological: She is alert.  Skin: Skin is warm and dry.    ED Course  Procedures (including critical care time) Labs Review Labs Reviewed - No data to display Imaging Review No results found.   EKG Interpretation None      MDM   Final diagnoses:  Visit for suture removal    SUTURE REMOVAL Performed by: Dorthula MatasGREENE,Emmersen Garraway G  Consent: Verbal consent obtained. Patient identity confirmed: provided demographic data Time out: Immediately prior to procedure a "time out" was called to verify the correct patient, procedure, equipment, support staff and site/side marked as  required.  Location details: left palm  Wound Appearance: clean  Sutures/Staples Removed: 11  Facility: sutures placed in this facility Patient tolerance: Patient tolerated the procedure well with no immediate complications.   27 y.o.Angel Wong's evaluation in the Emergency Department is complete. It has been determined that no acute conditions requiring further emergency intervention are present at this time. The patient/guardian have been advised of the diagnosis  and plan. We have discussed signs and symptoms that warrant return to the ED, such as changes or worsening in symptoms.  Vital signs are stable at discharge. Filed Vitals:   08/22/13 0941  BP: 116/66  Pulse: 62  Temp: 98.4 F (36.9 C)  Resp: 16    Patient/guardian has voiced understanding and agreed to follow-up with the PCP or specialist.     Dorthula Matas, PA-C 08/22/13 1033

## 2014-04-11 ENCOUNTER — Encounter (HOSPITAL_COMMUNITY): Payer: Self-pay | Admitting: Emergency Medicine

## 2014-07-14 IMAGING — CR DG HAND COMPLETE 3+V*L*
3 series · 3 of 3 positions shown · non-contrast
Comparison: None.

CLINICAL DATA: Laceration to palm of hand. Evaluate for foreign
body.

EXAM:
LEFT HAND - COMPLETE 3+ VIEW

[x hand pa left]
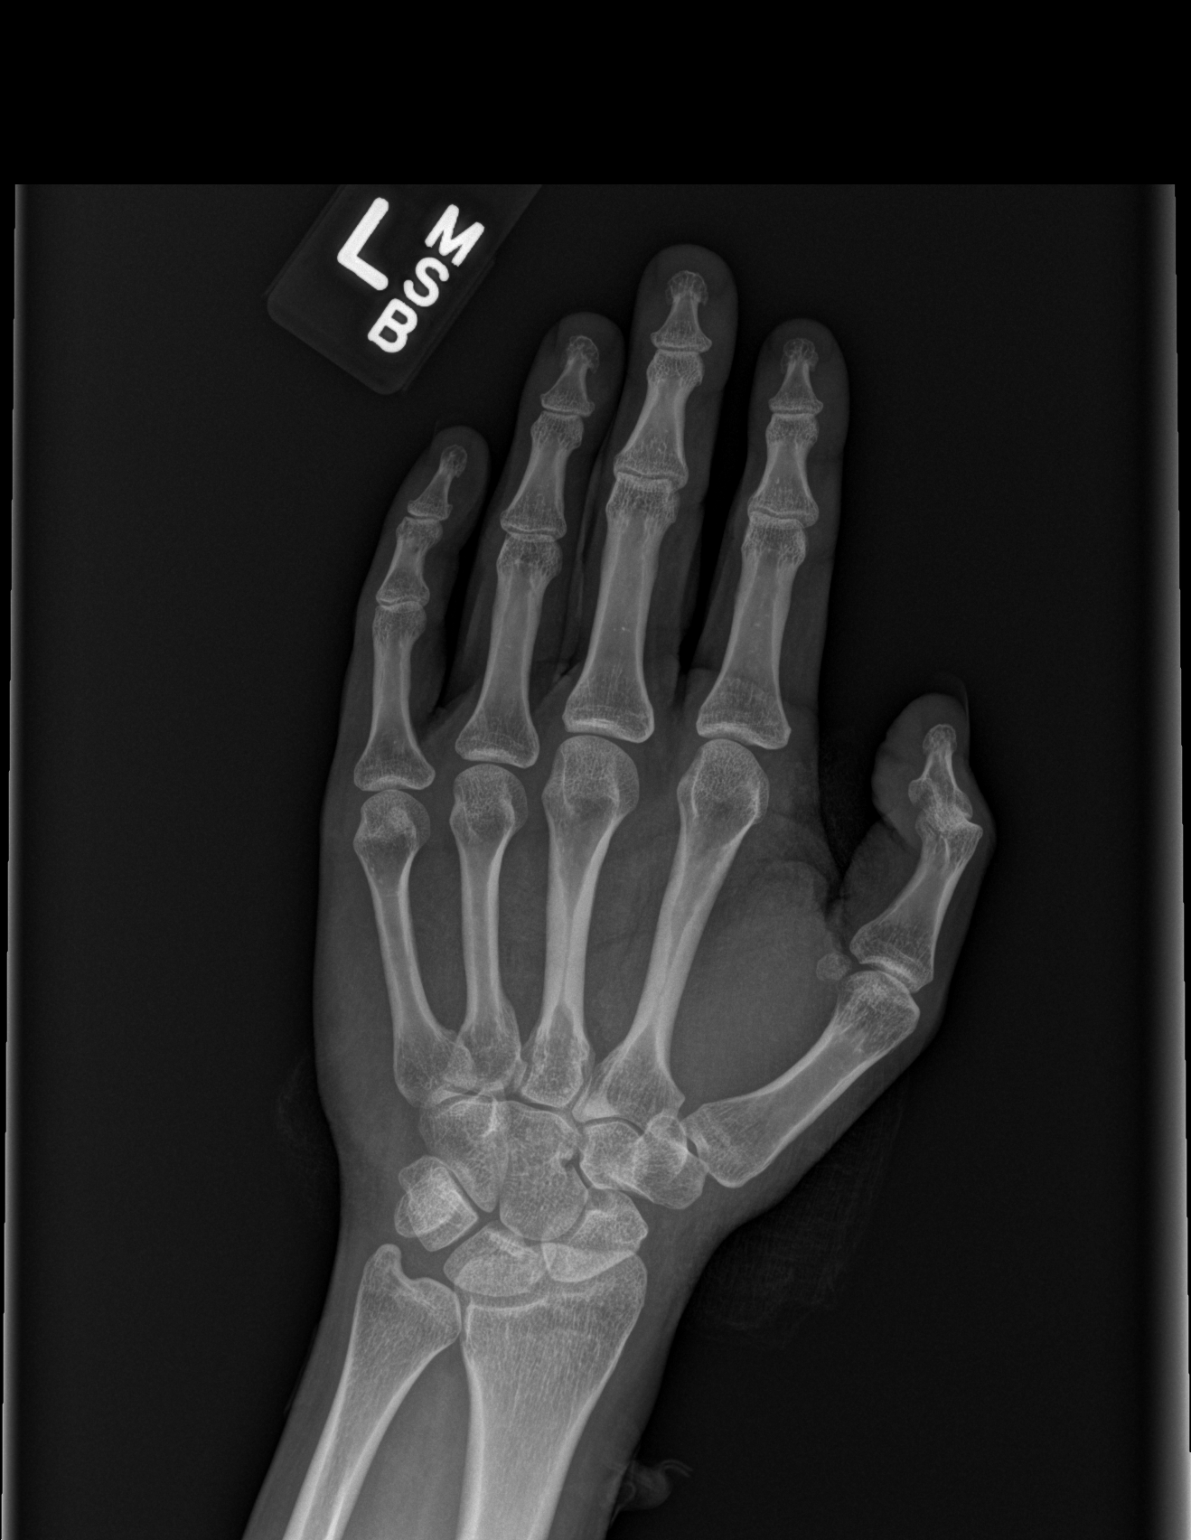

[x hand obl left]
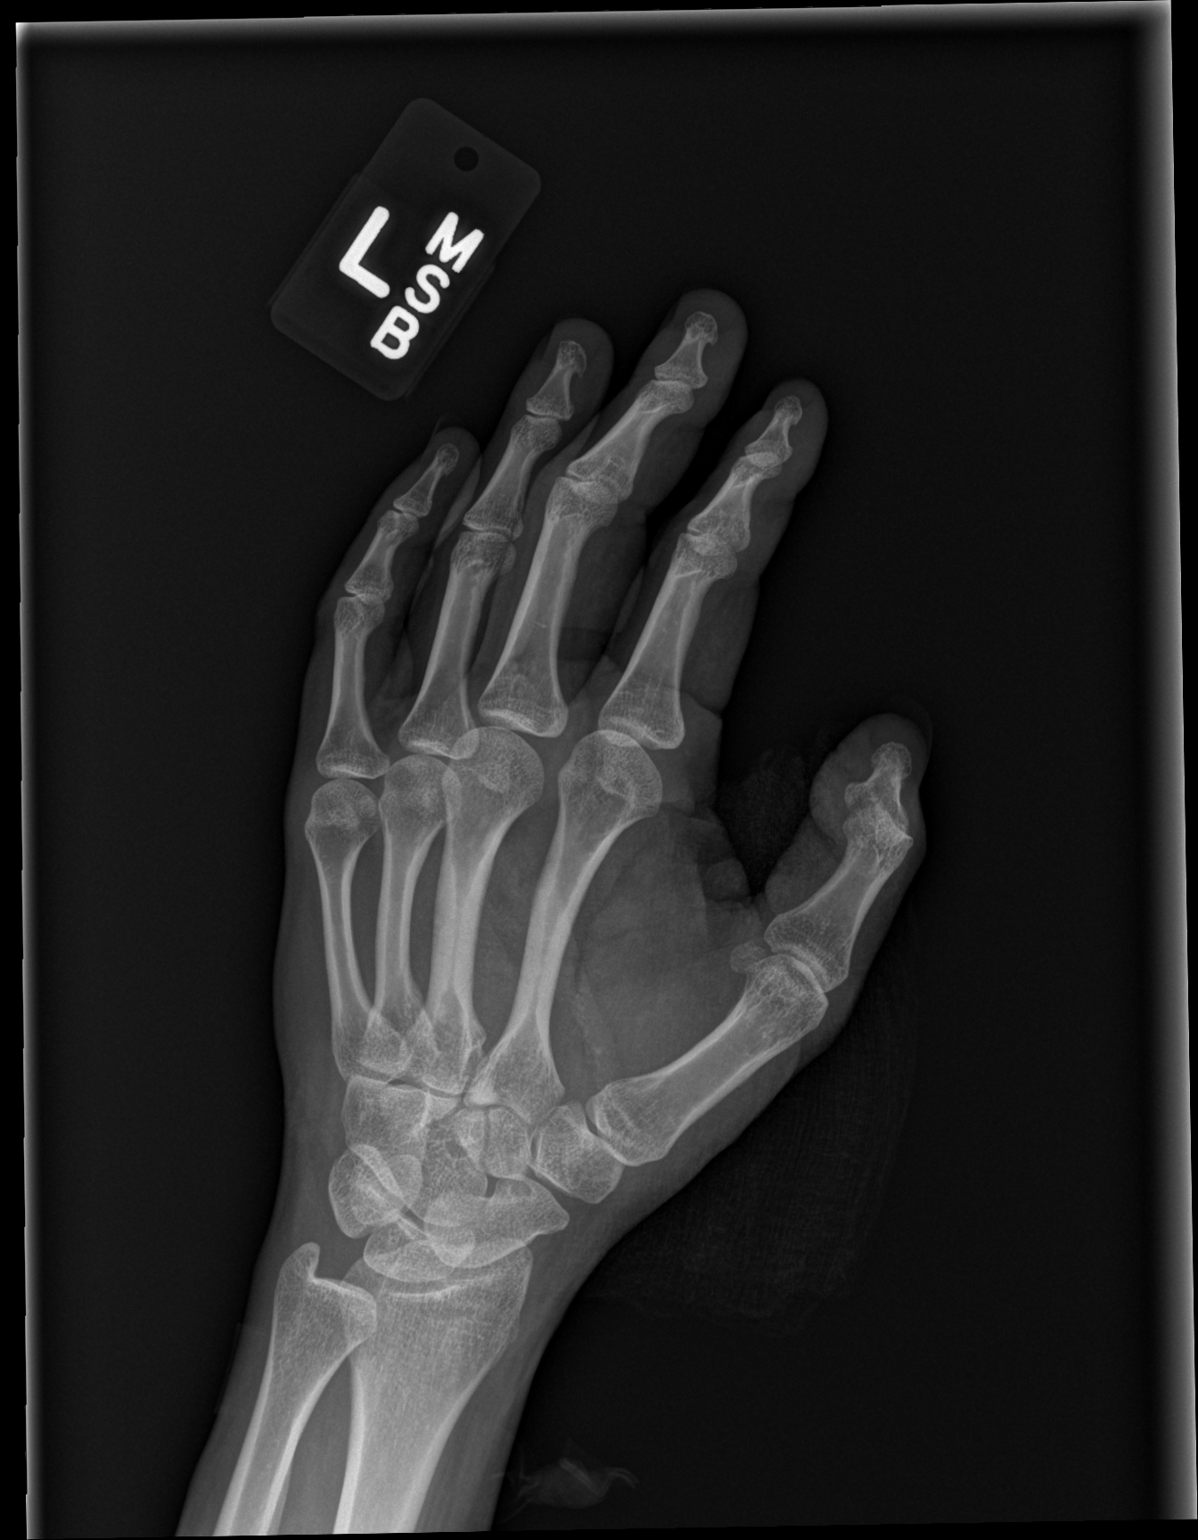

[x hand lat left]
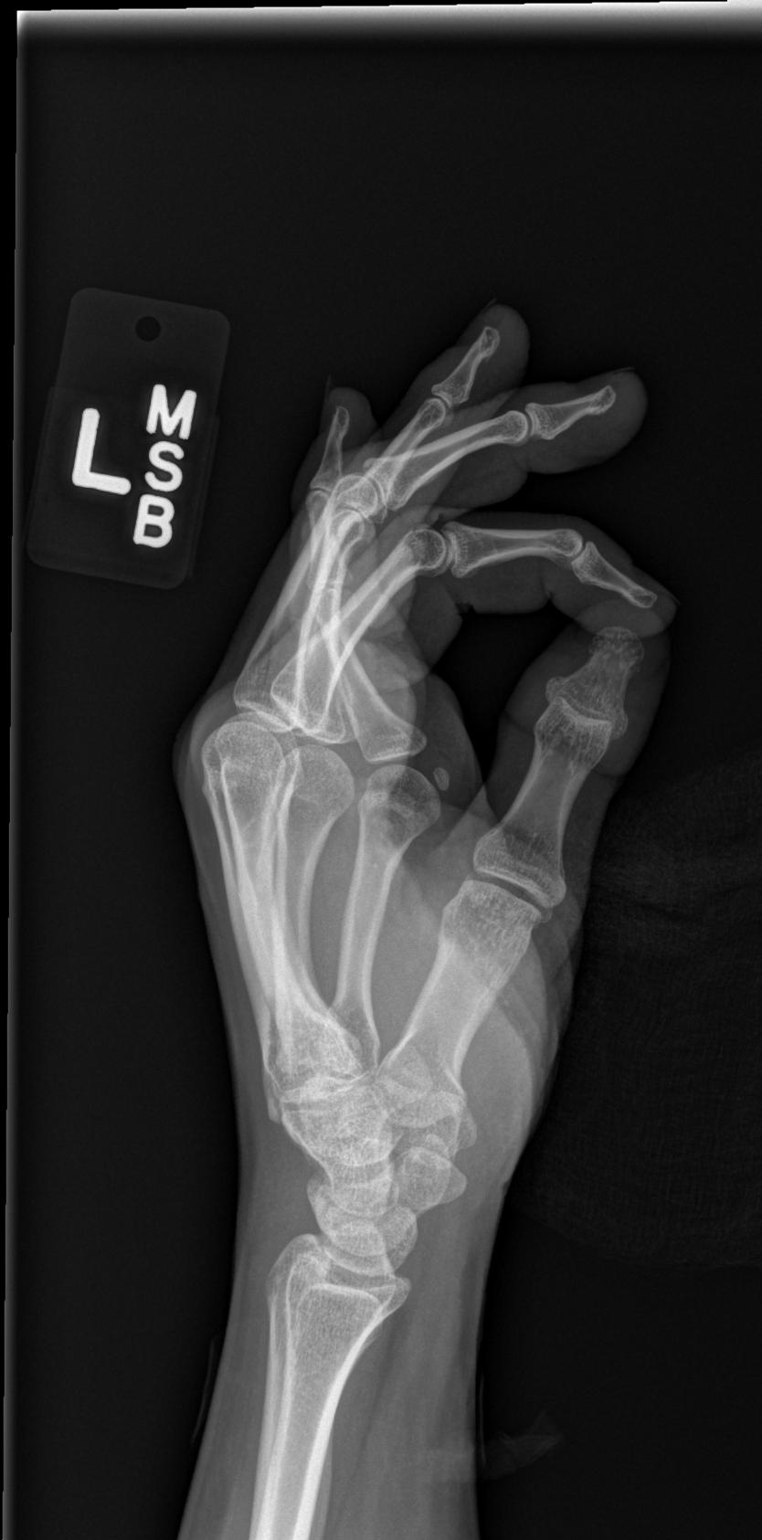

[3 of 3 positions shown; findings below may reference images not displayed]

FINDINGS: Bandaging material overlies the proximal left hand with underlying
soft tissue irregularity, compatible laceration. The no retained
foreign body identified. No acute fracture dislocation. Joint spaces
are maintained.
IMPRESSION: Soft tissue laceration to palm of hand. No radiopaque foreign body
identified. No acute fracture or dislocation.
